# Patient Record
Sex: Female | Born: 1985 | Race: Black or African American | Hispanic: No | Marital: Single | Smoking: Current every day smoker
Health system: Southern US, Community
[De-identification: ages and names within clinical notes are randomized; demographics above are authoritative.]

## PROBLEM LIST (undated history)

## (undated) HISTORY — PX: CHOLECYSTECTOMY: SHX55

---
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
## 2003-09-19 ENCOUNTER — Ambulatory Visit (HOSPITAL_COMMUNITY): Admission: RE | Admit: 2003-09-19 | Discharge: 2003-09-19 | Payer: Self-pay | Admitting: Obstetrics & Gynecology

## 2003-12-12 ENCOUNTER — Ambulatory Visit (HOSPITAL_COMMUNITY): Admission: RE | Admit: 2003-12-12 | Discharge: 2003-12-12 | Payer: Self-pay | Admitting: Obstetrics & Gynecology

## 2004-04-08 ENCOUNTER — Inpatient Hospital Stay (HOSPITAL_COMMUNITY): Admission: AD | Admit: 2004-04-08 | Discharge: 2004-04-11 | Payer: Self-pay | Admitting: Obstetrics & Gynecology

## 2005-06-01 ENCOUNTER — Ambulatory Visit (HOSPITAL_COMMUNITY): Admission: RE | Admit: 2005-06-01 | Discharge: 2005-06-01 | Payer: Self-pay | Admitting: Obstetrics & Gynecology

## 2005-07-29 ENCOUNTER — Inpatient Hospital Stay (HOSPITAL_COMMUNITY): Admission: RE | Admit: 2005-07-29 | Discharge: 2005-08-01 | Payer: Self-pay | Admitting: Obstetrics & Gynecology

## 2008-04-02 IMAGING — US US OB COMP +14 WK
2 series · 13 of 28 positions shown · non-contrast
Comparison: none

CLINICAL DATA: Late prenatal care.  Evaluate anatomy.

[Series 1: us ob comp +14 wk · 0.29mm/px · 9 of 46 slices shown (1 of 2)]
[im 3/46]
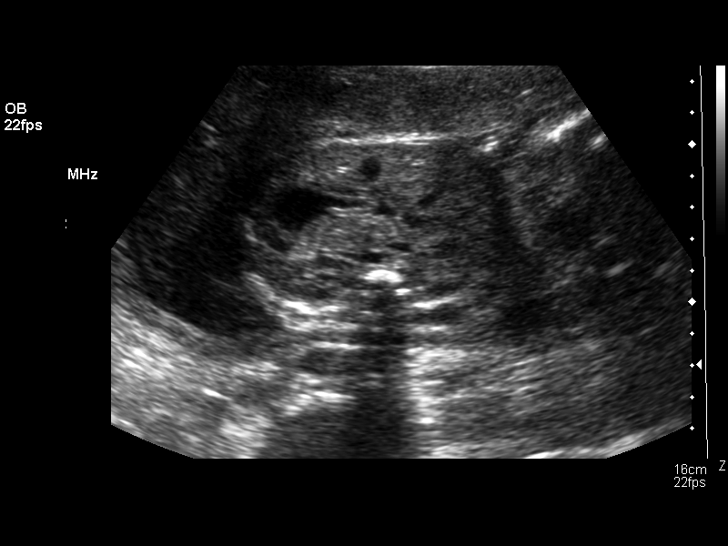
[im 8/46]
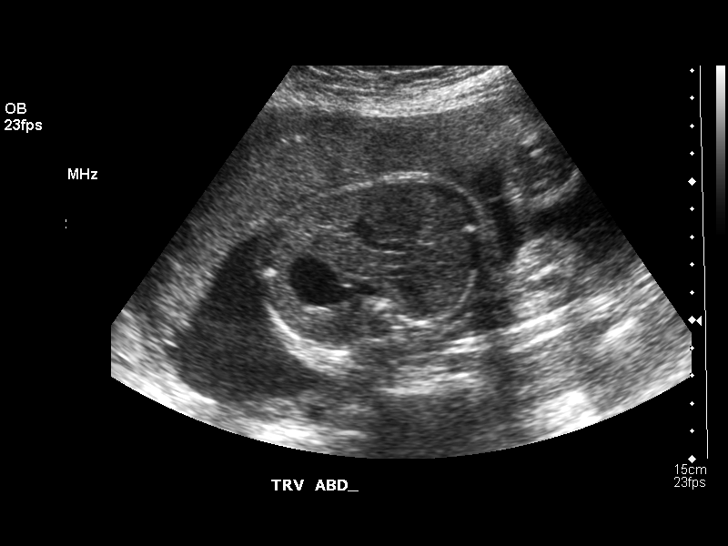
[im 12/46]
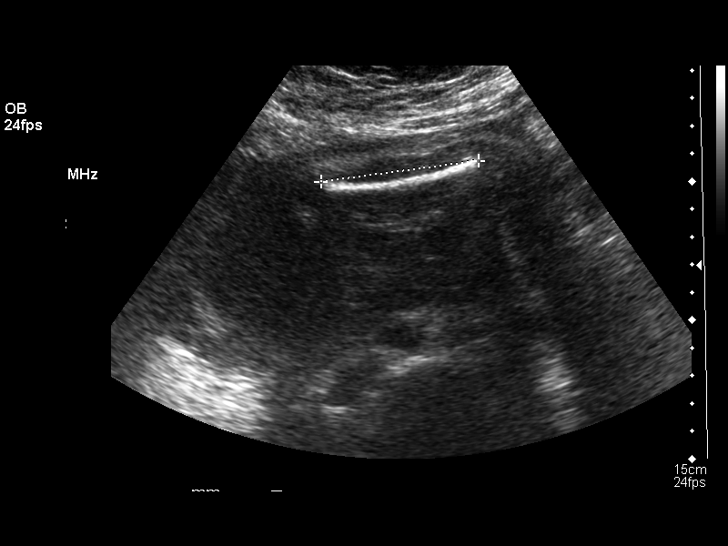
[im 17/46]
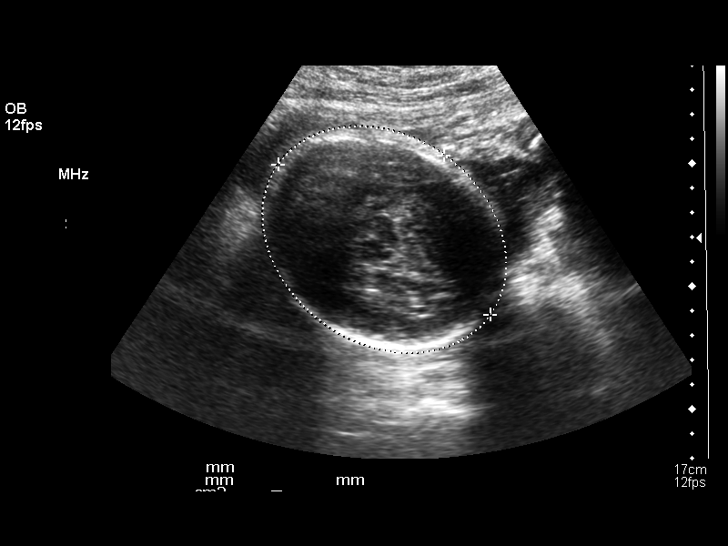
[im 22/46]
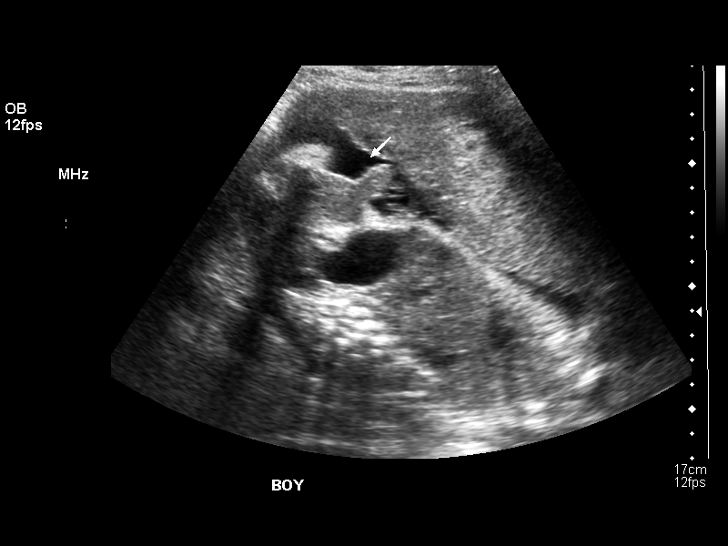
[im 27/46]
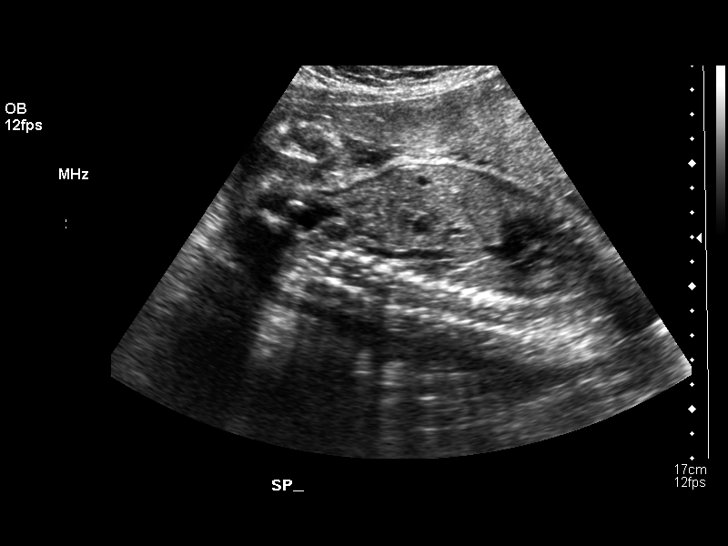
[im 34/46]
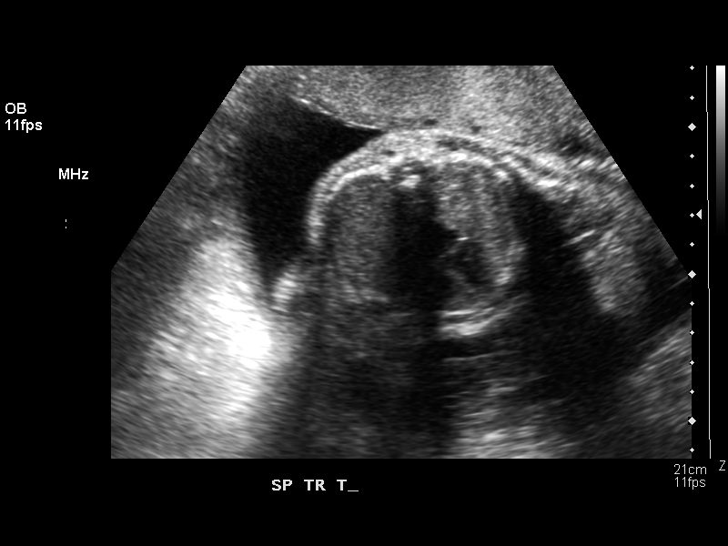
[im 38/46]
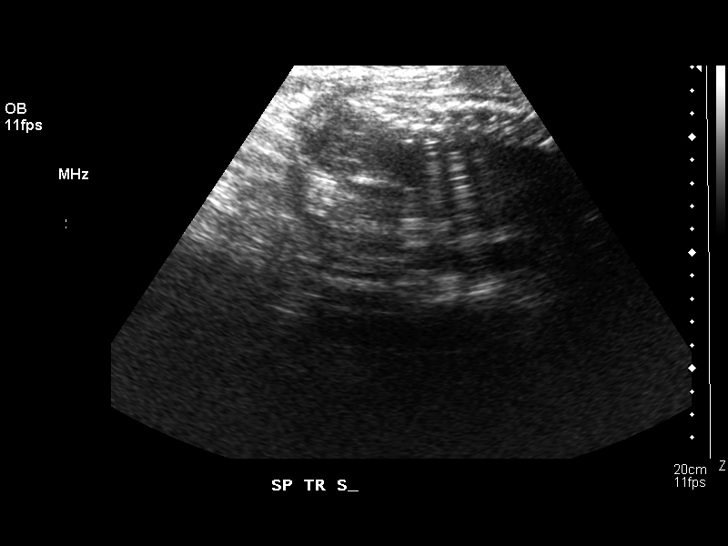
[im 43/46]
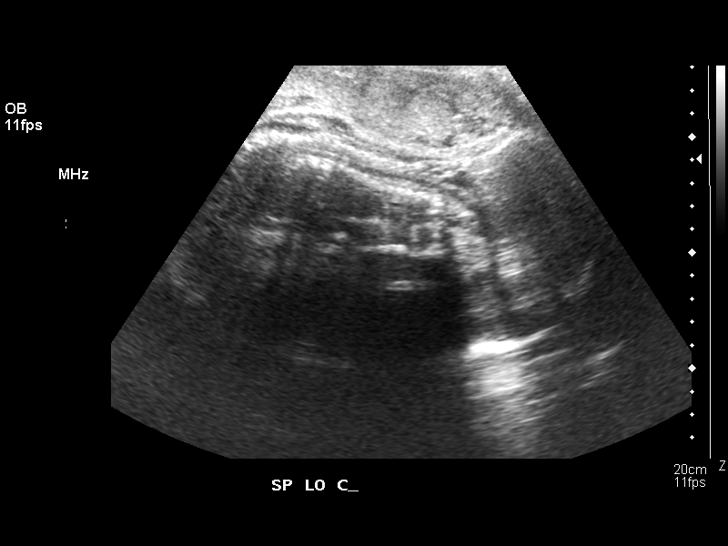

[Series 1: us ob comp +14 wk · 4 of 19 slices shown (2 of 2)]
[im 1/19]
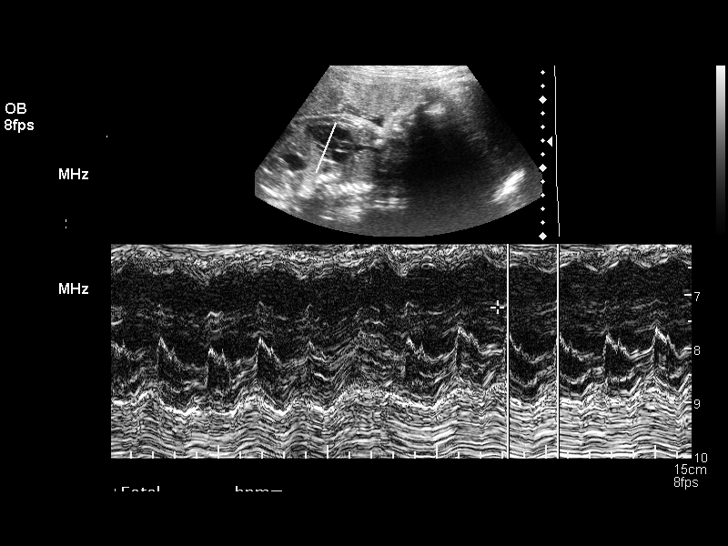
[im 6/19]
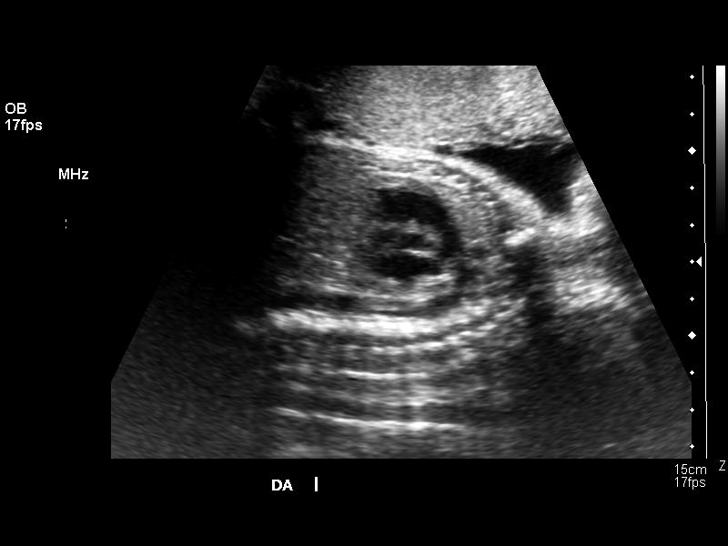
[im 11/19]
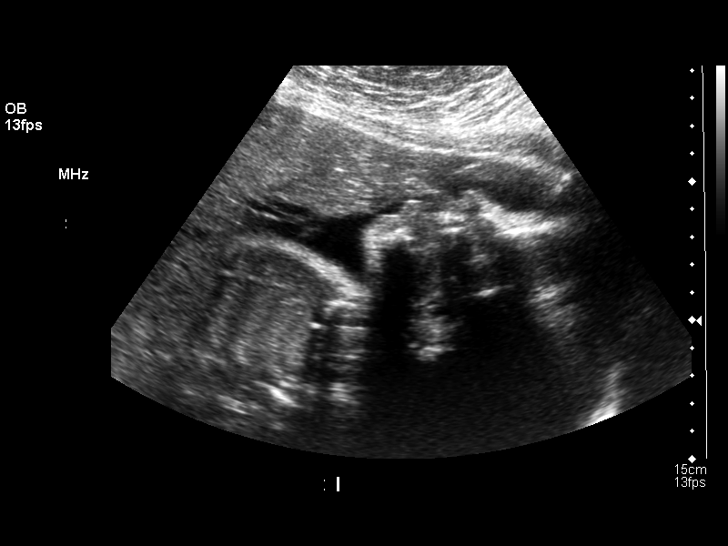
[im 16/19]
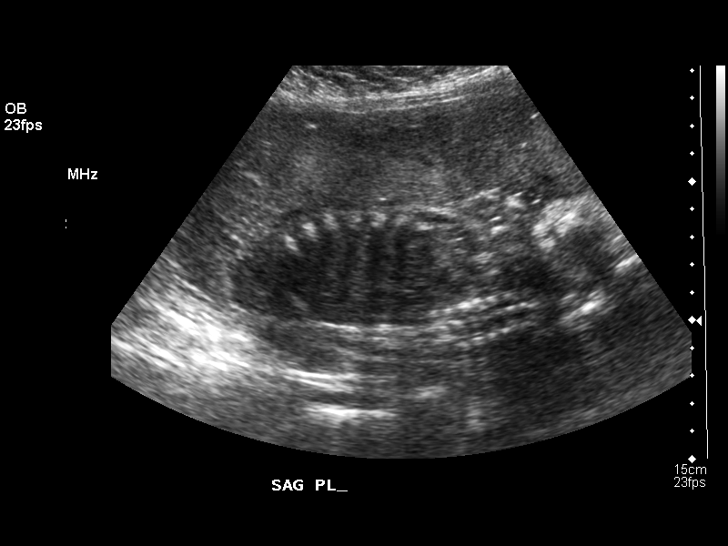

[13 of 28 positions shown; findings below may reference images not displayed]

OBSTETRICAL ULTRASOUND:
 Number of Fetuses: 1
 Heart Rate:  131
 Movement:  Yes
 Breathing:  No  
 Presentation:  Cephalic
 Placental Location:  Anterior     Grade:  I
 Previa:  No
 Amniotic Fluid (Subjective):  Normal
 Amniotic Fluid (Objective):   12.8 cm AFI (5th -95th%ile = 8.8 ? 23.8 cm for 31 wks)

 FETAL BIOMETRY
 BPD:   7.9 cm  31 w 6 d
 HC:   29.8 cm  33 w 0 d
 AC:   25.7 cm  29 w 6 d
 FL:    5.7 cm  30 w 0 d

 MEAN GA:  31 w 1 d  US EDC:  08/02/05

 EFW:  9986 g (H) 50th ? 75th%ile (3305 ? 8881 g) For 31 wks

 FETAL ANATOMY
 Lateral Ventricles:    Visualized 
 Thalami/CSP:      Previously seen 
 Posterior Fossa:  Previously seen 
 Nuchal Region:    N/A
 Spine:      Visualized 
 4 Chamber Heart on Left:      Visualized 
 Stomach on Left:      Visualized 
 3 Vessel Cord:    Visualized 
 Cord Insertion site:    Visualized 
 Kidneys:  Visualized 
 Bladder:  Visualized 
 Extremities:      Limited

 ADDITIONAL ANATOMY VISUALIZED:  LVOT, RVOT, orbits, diaphragm, ductal arch, aortic arch, and male genitalia.

 Evaluation limited by:  Fetal position and advanced gestational age.

 MATERNAL UTERINE AND ADNEXAL FINDINGS
 Cervix:  3.4 cm Transabdominally
IMPRESSION: 1. Single living intrauterine fetus in cephalic presentation with subjectively and quantitatively normal amniotic fluid volume.  Estimated mean gestational age by ultrasound today is 31 weeks 1 day which correlates well with the reported gestational age by LMP.  
 2.  The visualized fetal anatomy is unremarkable although assessment is limited by fetal position and the advanced gestational age, as well as maternal habitus.  The upper extremities were not discretely identified secondary to position.  Posterior fossa and thalamic nuclei were not well seen, nor was the upper lip, profile, heel, and 5th digit.

## 2012-01-12 ENCOUNTER — Emergency Department (HOSPITAL_COMMUNITY)
Admission: EM | Admit: 2012-01-12 | Discharge: 2012-01-12 | Disposition: A | Discharge status: Home / Self Care | Payer: Self-pay | Attending: Emergency Medicine | Admitting: Emergency Medicine

## 2012-01-12 ENCOUNTER — Encounter (HOSPITAL_COMMUNITY): Payer: Self-pay | Admitting: Emergency Medicine

## 2012-01-12 DIAGNOSIS — F17200 Nicotine dependence, unspecified, uncomplicated: Secondary | ICD-10-CM | POA: Insufficient documentation

## 2012-01-12 DIAGNOSIS — M545 Low back pain, unspecified: Secondary | ICD-10-CM | POA: Insufficient documentation

## 2012-01-12 MED ORDER — HYDROCODONE-ACETAMINOPHEN 5-325 MG PO TABS
2.0000 | ORAL_TABLET | Freq: Once | ORAL | Status: AC
  Administered 2012-01-12: 2 via ORAL
  Filled 2012-01-12: qty 2

## 2012-01-12 MED ORDER — IBUPROFEN 800 MG PO TABS: 800.0000 mg | ORAL_TABLET | Freq: Three times a day (TID) | ORAL | Status: DC

## 2012-01-12 NOTE — ED Provider Notes (Addendum)
History  This chart was scribed for Doug Sou, MD by Shari Heritage, ED Scribe. The patient was seen in room TR05C/TR05C. Patient's care was started at 1706.  CSN: [id number]  Arrival date & time 01/12/12  1619   First MD Initiated Contact with Patient 01/12/12 1706      Chief Complaint  Patient presents with  . Back Pain    The history is provided by the patient. No language interpreter was used.    HPI Comments: Natalie Mclean is a 26 y.o. female who presents to the Emergency Department complaining of moderate to severe, constant, dull, non-radiating lumbosacral pain onset 11 hours ago. Patient denies any other symptoms or history of back pain prior to today. Patient says that the pain began when she suddenly reached to the right to pick something up.pain is worse with changing positions improved with remaining still, nonradiating, moderate to severe. No treatment prior to coming here no loss of bladder or bowel control no other associated symptoms Patient smokes daily but does not drink alcohol. She does not use any illegal drugs. Patient has a surgical history of C-section x3. She reports no other relevant past medical, surgical or family history.  History  Substance Use Topics  . Smoking status: Current Every Day Smoker  . Smokeless tobacco: Not on file  . Alcohol Use: No    OB History    Grav Para Term Preterm Abortions TAB SAB Ect Mult Living                  Review of Systems  Constitutional: Negative.   HENT: Negative.   Respiratory: Negative.   Cardiovascular: Negative.   Gastrointestinal: Negative.   Musculoskeletal: Positive for back pain.  Skin: Negative.   Neurological: Negative.   Hematological: Negative.   Psychiatric/Behavioral: Negative.   All other systems reviewed and are negative.    Allergies  Review of patient's allergies indicates no known allergies.  Home Medications  No current outpatient prescriptions on file.  Triage Vitals: BP 124/80   Pulse 85  Temp 98.7 F (37.1 C) (Oral)  Resp 20  SpO2 100%  Physical Exam  Nursing note and vitals reviewed. Constitutional: She is oriented to person, place, and time. She appears well-developed and well-nourished. No distress.  HENT:  Head: Normocephalic and atraumatic.  Eyes: EOM are normal. Pupils are equal, round, and reactive to light.  Neck: Neck supple. No tracheal deviation present.  Cardiovascular: Normal rate.   Pulmonary/Chest: Effort normal. No respiratory distress.  Abdominal: Soft. She exhibits no distension. There is no tenderness.       Morbidly obese  Musculoskeletal: Normal range of motion. She exhibits tenderness. She exhibits no edema.       Thoracic spine nontender, tender over lumbar area, minimally pain is exacerbated when she sits up from a supine position.  Neurological: She is alert and oriented to person, place, and time. She has normal reflexes. No cranial nerve deficit or sensory deficit. She exhibits normal muscle tone. Coordination normal.       Gait normal  Skin: Skin is warm and dry.  Psychiatric: She has a normal mood and affect. Her behavior is normal.    ED Course  Procedures (including critical care time) DIAGNOSTIC STUDIES: Oxygen Saturation is 100% on room air, normal by my interpretation.    COORDINATION OF CARE: 5:16 PM- Patient informed of current plan for treatment and evaluation and agrees with plan at this time.    No diagnosis found.  MDM  X-rays not indicated. Plan prescription ibuprofen. Referral [facility] if not better in a week Diagnosis low back pain      I personally performed the services described in this documentation, which was scribed in my presence. The recorded information has been reviewed and is accurate.     Doug Sou, MD 01/12/12 1725  Doug Sou, MD 01/12/12 1726

## 2012-01-12 NOTE — ED Notes (Signed)
Pt c/o lower back pain at the tailbone after bending over today to pick up child

## 2012-08-28 ENCOUNTER — Encounter (HOSPITAL_COMMUNITY): Payer: Self-pay | Admitting: Emergency Medicine

## 2012-08-28 ENCOUNTER — Emergency Department (HOSPITAL_COMMUNITY)
Admission: EM | Admit: 2012-08-28 | Discharge: 2012-08-28 | Disposition: A | Discharge status: Home / Self Care | Payer: Self-pay | Attending: Emergency Medicine | Admitting: Emergency Medicine

## 2012-08-28 DIAGNOSIS — F17200 Nicotine dependence, unspecified, uncomplicated: Secondary | ICD-10-CM | POA: Insufficient documentation

## 2012-08-28 DIAGNOSIS — J020 Streptococcal pharyngitis: Secondary | ICD-10-CM | POA: Insufficient documentation

## 2012-08-28 DIAGNOSIS — H9209 Otalgia, unspecified ear: Secondary | ICD-10-CM | POA: Insufficient documentation

## 2012-08-28 LAB — RAPID STREP SCREEN (MED CTR MEBANE ONLY): Streptococcus, Group A Screen (Direct): POSITIVE — AB

## 2012-08-28 MED ORDER — IBUPROFEN 600 MG PO TABS: 600.0000 mg | ORAL_TABLET | Freq: Four times a day (QID) | ORAL | Status: AC | PRN

## 2012-08-28 MED ORDER — IBUPROFEN 400 MG PO TABS
600.0000 mg | ORAL_TABLET | Freq: Once | ORAL | Status: AC
  Administered 2012-08-28: 600 mg via ORAL
  Filled 2012-08-28: qty 1

## 2012-08-28 MED ORDER — PENICILLIN G BENZATHINE 1200000 UNIT/2ML IM SUSP
1.2000 10*6.[IU] | Freq: Once | INTRAMUSCULAR | Status: AC
  Administered 2012-08-28: 1.2 10*6.[IU] via INTRAMUSCULAR
  Filled 2012-08-28: qty 2

## 2012-08-28 NOTE — ED Notes (Signed)
Pt c/o sore throat and bilateral ear pain x 3 days; pt sts some body aches

## 2012-08-28 NOTE — ED Provider Notes (Signed)
History    CSN: [id number] Arrival date & time 08/28/12  1343  First MD Initiated Contact with Patient 08/28/12 1552     Chief Complaint  Patient presents with  . Sore Throat  . Otalgia   (Consider location/radiation/quality/duration/timing/severity/associated sxs/prior Treatment) HPI Comments: Mothers child with history of strep throat 2 weeks ago.  Patient is a 27 y.o. female presenting with pharyngitis. The history is provided by the patient and a parent. No language interpreter was used.  Sore Throat This is a new problem. The current episode started yesterday. The problem occurs constantly. The problem has not changed since onset.Pertinent negatives include no chest pain and no shortness of breath. The symptoms are aggravated by swallowing. Nothing relieves the symptoms. She has tried nothing for the symptoms. The treatment provided no relief.   History reviewed. No pertinent past medical history. Past Surgical History  Procedure Laterality Date  . Cesarean section      x3   History reviewed. No pertinent family history. History  Substance Use Topics  . Smoking status: Current Every Day Smoker  . Smokeless tobacco: Not on file  . Alcohol Use: No   OB History   Grav Para Term Preterm Abortions TAB SAB Ect Mult Living                 Review of Systems  Respiratory: Negative for shortness of breath.   Cardiovascular: Negative for chest pain.  All other systems reviewed and are negative.    Allergies  Review of patient's allergies indicates no known allergies.  Home Medications  No current outpatient prescriptions on file. BP 128/81  Pulse 87  Temp(Src) 98.3 F (36.8 C) (Oral)  Resp 18  SpO2 97% Physical Exam  Nursing note and vitals reviewed. Constitutional: She is oriented to person, place, and time. She appears well-developed and well-nourished.  HENT:  Head: Normocephalic.  Right Ear: External ear normal.  Left Ear: External ear normal.  Nose: Nose  normal.  Mouth/Throat: Oropharynx is clear and moist. No oropharyngeal exudate.  Bilateral tonsils are erythematous uvula midline tonsils symmetric  Eyes: EOM are normal. Pupils are equal, round, and reactive to light. Right eye exhibits no discharge. Left eye exhibits no discharge.  Neck: Normal range of motion. Neck supple. No tracheal deviation present.  No nuchal rigidity no meningeal signs  Cardiovascular: Normal rate and regular rhythm.   Pulmonary/Chest: Effort normal and breath sounds normal. No stridor. No respiratory distress. She has no wheezes. She has no rales.  Abdominal: Soft. She exhibits no distension and no mass. There is no tenderness. There is no rebound and no guarding.  Musculoskeletal: Normal range of motion. She exhibits no edema and no tenderness.  Neurological: She is alert and oriented to person, place, and time. She has normal reflexes. No cranial nerve deficit. Coordination normal.  Skin: Skin is warm. No rash noted. She is not diaphoretic. No erythema. No pallor.  No pettechia no purpura    ED Course  Procedures (including critical care time) Labs Reviewed  RAPID STREP SCREEN - Abnormal; Notable for the following:    Streptococcus, Group A Screen (Direct) POSITIVE (*)    All other components within normal limits   No results found. 1. Strep throat     MDM  Strep throat screen positive for group A strep. Patient wishes for intramuscular Bicillin. I will give Motrin for pain and fever. Uvula midline tonsils symmetric making peritonsillar abscess unlikely. No nuchal rigidity or toxicity to suggest meningitis.  At time of discharge home patient is well-appearing and in no distress.  Arley Phenix, MD 08/28/12 1600

## 2014-01-08 ENCOUNTER — Emergency Department (HOSPITAL_COMMUNITY): Payer: Self-pay

## 2014-01-08 ENCOUNTER — Encounter (HOSPITAL_COMMUNITY): Payer: Self-pay | Admitting: Emergency Medicine

## 2014-01-08 ENCOUNTER — Emergency Department (HOSPITAL_COMMUNITY)
Admission: EM | Admit: 2014-01-08 | Discharge: 2014-01-08 | Disposition: A | Discharge status: Home / Self Care | Payer: Self-pay | Attending: Emergency Medicine | Admitting: Emergency Medicine

## 2014-01-08 DIAGNOSIS — R05 Cough: Secondary | ICD-10-CM

## 2014-01-08 DIAGNOSIS — J40 Bronchitis, not specified as acute or chronic: Secondary | ICD-10-CM

## 2014-01-08 DIAGNOSIS — J209 Acute bronchitis, unspecified: Secondary | ICD-10-CM | POA: Insufficient documentation

## 2014-01-08 DIAGNOSIS — M791 Myalgia: Secondary | ICD-10-CM | POA: Insufficient documentation

## 2014-01-08 DIAGNOSIS — Z720 Tobacco use: Secondary | ICD-10-CM | POA: Insufficient documentation

## 2014-01-08 DIAGNOSIS — R059 Cough, unspecified: Secondary | ICD-10-CM

## 2014-01-08 MED ORDER — AZITHROMYCIN 250 MG PO TABS: 250.0000 mg | ORAL_TABLET | Freq: Every day | ORAL | Status: AC

## 2014-01-08 MED ORDER — DEXTROMETHORPHAN POLISTIREX 30 MG/5ML PO LQCR: 30.0000 mg | ORAL | Status: AC | PRN

## 2014-01-08 MED ORDER — ALBUTEROL SULFATE HFA 108 (90 BASE) MCG/ACT IN AERS: 1.0000 | INHALATION_SPRAY | Freq: Four times a day (QID) | RESPIRATORY_TRACT | Status: AC | PRN

## 2014-01-08 NOTE — ED Notes (Signed)
To x-ray

## 2014-01-08 NOTE — Discharge Instructions (Signed)
Take azithromycin as directed until gone. Take delsym as needed for cough. Use inhaler as needed for wheezing. Refer to attached documents for more information.

## 2014-01-08 NOTE — ED Notes (Signed)
Patient states started getting sick on Saturday.  Patient is having nasal congestion, and cough with chest congestion.   Patient states "whole chest hurts when I cough".   Patient states that she took tylenol and used nose spray at home without help.

## 2014-01-08 NOTE — ED Provider Notes (Signed)
CSN: [id number]     Arrival date & time 01/08/14  1046 History  This chart was scribed for non-physician practitioner, Emilia BeckKaitlyn Aydin Cavalieri, PA-C working with Ward GivensIva L Knapp, MD by Greggory StallionKayla Andersen, ED scribe. This patient was seen in room TR07C/TR07C and the patient's care was started at 12:43 PM.   Chief Complaint  Patient presents with  . Nasal Congestion  . Cough   The history is provided by the patient. No language interpreter was used.    HPI Comments: Natalie Mclean is a 28 y.o. female who presents to the Emergency Department complaining of nasal congestion, cough and chest congestion that started 3 days ago. States her chest hurts when she coughs. Reports subjective fever last night. Pt has taken tylenol sinus severe and used afrin with little relief. Denies history of asthma.   History reviewed. No pertinent past medical history. Past Surgical History  Procedure Laterality Date  . Cesarean section      x3  . Cholecystectomy     No family history on file. History  Substance Use Topics  . Smoking status: Current Every Day Smoker -- 0.50 packs/day    Types: Cigarettes  . Smokeless tobacco: Not on file  . Alcohol Use: No   OB History    No data available     Review of Systems  Constitutional: Positive for fever (subjective).  HENT: Positive for congestion.   Respiratory: Positive for cough.   Musculoskeletal: Positive for myalgias.  All other systems reviewed and are negative.  Allergies  Review of patient's allergies indicates no known allergies.  Home Medications   Prior to Admission medications   Medication Sig Start Date End Date Taking? Authorizing Provider  ibuprofen (ADVIL,MOTRIN) 600 MG tablet Take 1 tablet (600 mg total) by mouth every 6 (six) hours as needed for pain or fever. 08/28/12   Arley Pheniximothy M Galey, MD   BP 135/80 mmHg  Pulse 92  Temp(Src) 98.1 F (36.7 C) (Oral)  Resp 16  SpO2 100%  LMP 12/24/2013   Physical Exam  Constitutional: She is oriented to  person, place, and time. She appears well-developed and well-nourished. No distress.  HENT:  Head: Normocephalic and atraumatic.  Eyes: Conjunctivae and EOM are normal.  Neck: Neck supple. No tracheal deviation present.  Cardiovascular: Normal rate, regular rhythm and normal heart sounds.   Pulmonary/Chest: Effort normal. No respiratory distress. She has no wheezes. She has rhonchi. She has no rales.  Mild expiratory rhonchi in bilateral lungs.  Musculoskeletal: Normal range of motion.  Neurological: She is alert and oriented to person, place, and time.  Skin: Skin is warm and dry.  Psychiatric: She has a normal mood and affect. Her behavior is normal.  Nursing note and vitals reviewed.   ED Course  Procedures (including critical care time)  DIAGNOSTIC STUDIES: Oxygen Saturation is 100% on RA, normal by my interpretation.    COORDINATION OF CARE: 12:45 PM-Advised pt of xray results. Discussed treatment plan which includes cough syrup, inhaler and a z-pack with pt at bedside and pt agreed to plan.   Labs Review Labs Reviewed - No data to display  Imaging Review Dg Chest 2 View  01/08/2014   CLINICAL DATA:  Chest pain for 3 days. Chest pain starts in the center of the chest and moves to the back. Shortness of breath. History of smoking.  EXAM: CHEST  2 VIEW  COMPARISON:  None.  FINDINGS: Mild peribronchial thickening. Heart and mediastinal contours are within normal limits. No focal opacities  or effusions. No acute bony abnormality.  IMPRESSION: Bronchitic changes.   Electronically Signed   By: Charlett NoseKevin  Dover M.D.   On: 01/08/2014 12:36     EKG Interpretation None      MDM   Final diagnoses:  Cough  Bronchitis    12:48 PM Chest xray unremarkable for acute changes. Patient likely has bronchitis and will be treated with azithromycin, delsym, and albuterol. Vitals stable and patient afebrile.   I personally performed the services described in this documentation, which was  scribed in my presence. The recorded information has been reviewed and is accurate.  Emilia BeckKaitlyn Clodagh Odenthal, PA-C 01/08/14 [address][id number]  Virna Livengood, PA-C 01/12/14 2215  Ward GivensIva L Knapp, MD 01/14/14 1308

## 2016-11-09 IMAGING — CR DG CHEST 2V
2 series · 2 of 2 positions shown · non-contrast
Comparison: None.

CLINICAL DATA: Chest pain for 3 days. Chest pain starts in the
center of the chest and moves to the back. Shortness of breath.
History of smoking.

EXAM:
CHEST  2 VIEW

[w chest pa]
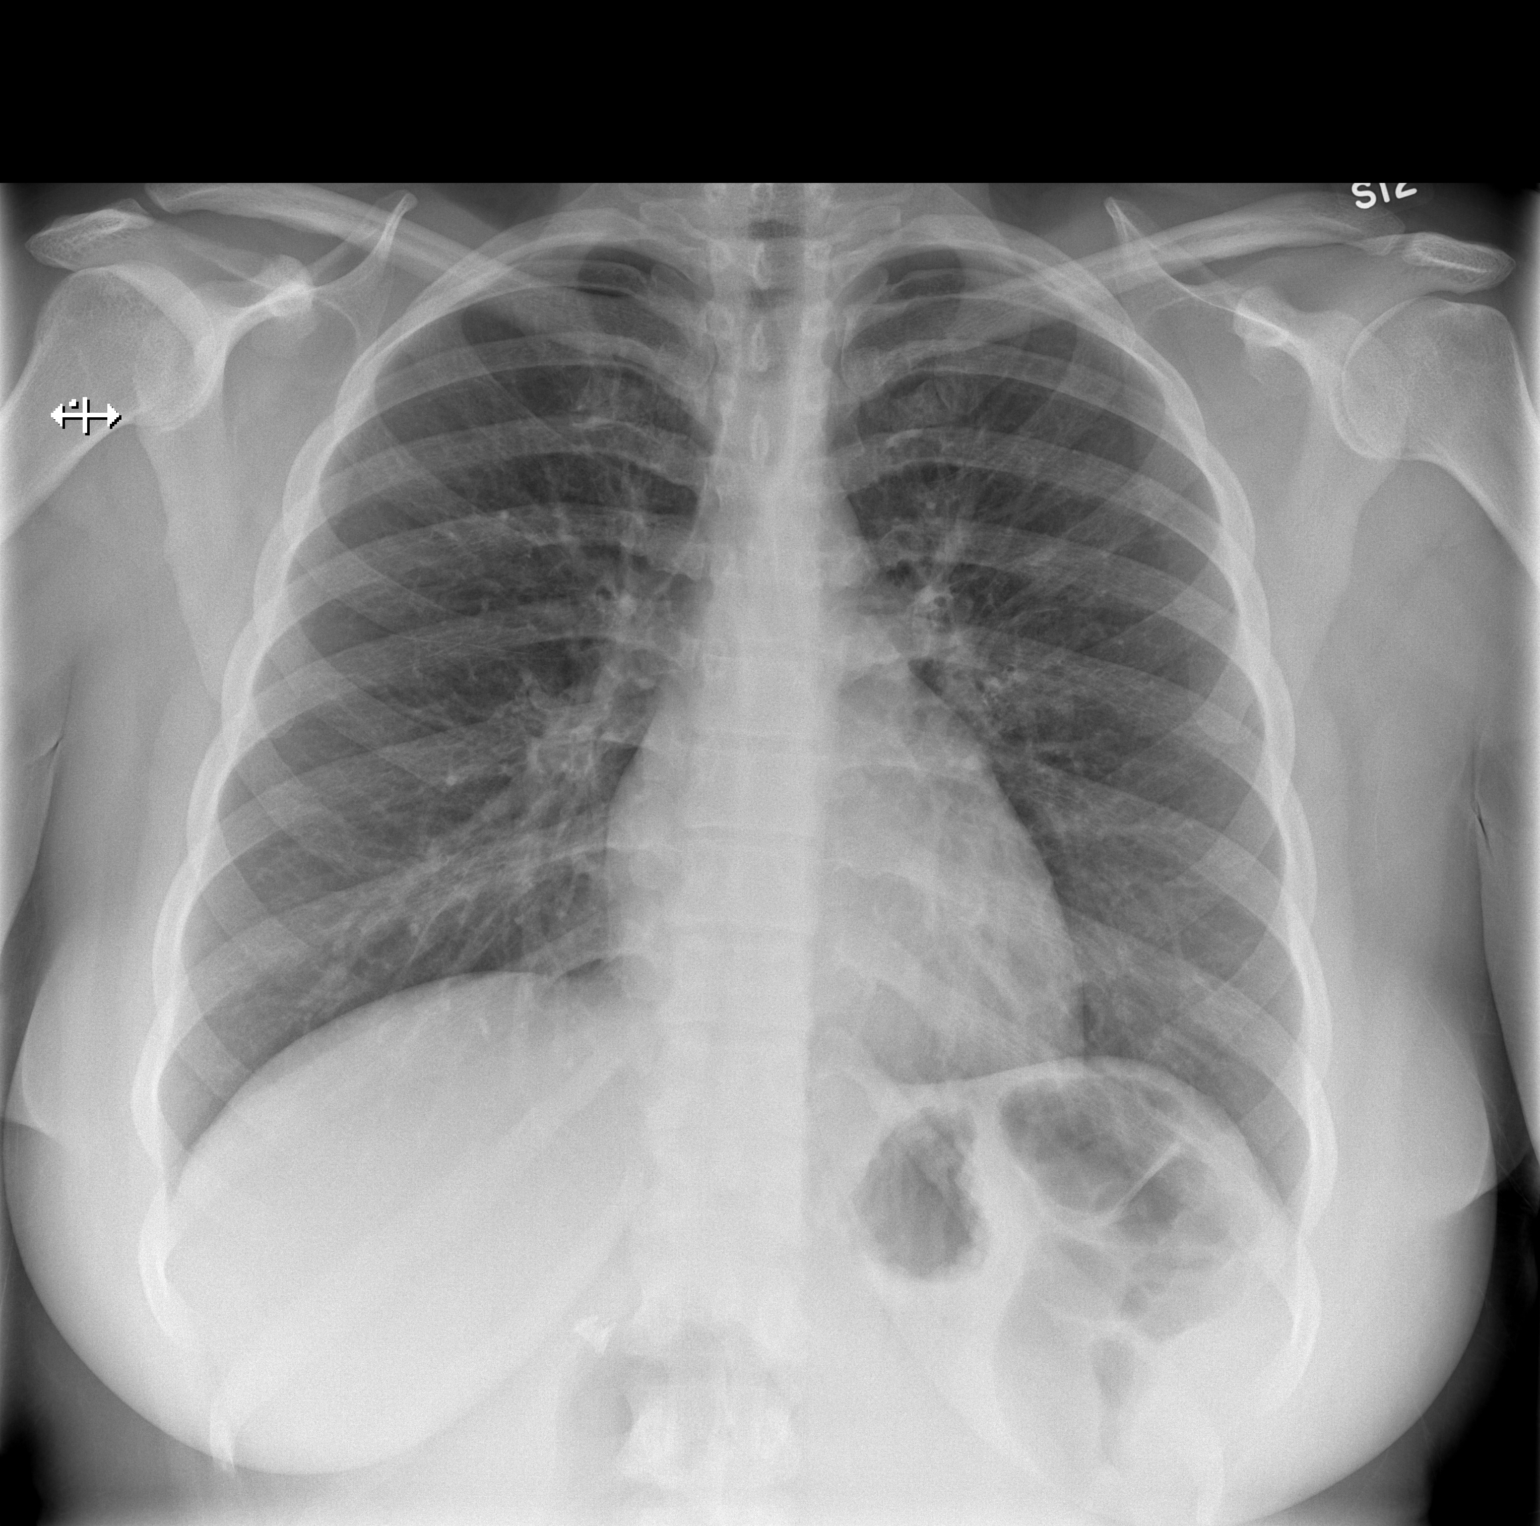

[w chest lat]
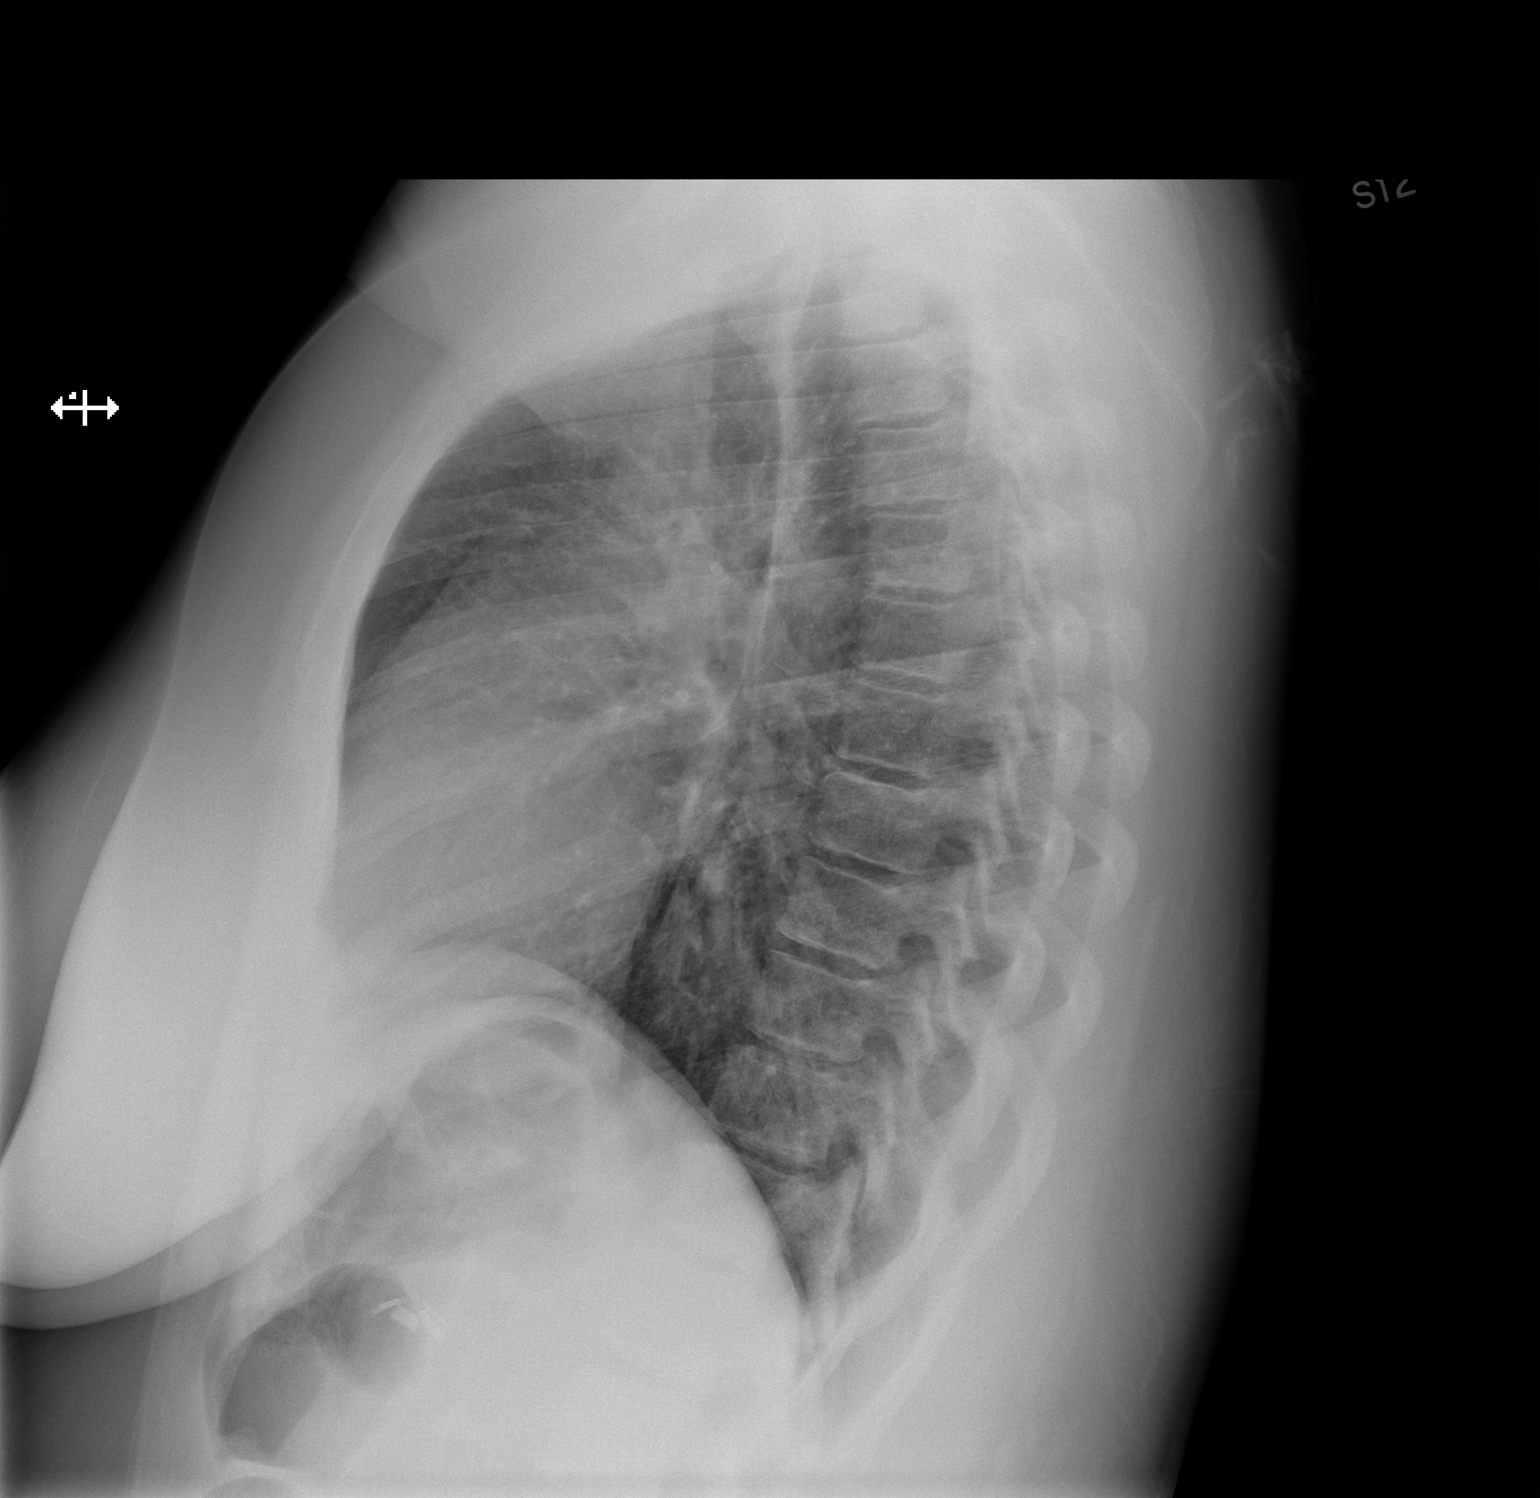

[2 of 2 positions shown; findings below may reference images not displayed]

FINDINGS: Mild peribronchial thickening. Heart and mediastinal contours are
within normal limits. No focal opacities or effusions. No acute bony
abnormality.
IMPRESSION: Bronchitic changes.
